# Patient Record
Sex: Female | Born: 1995 | Race: Black or African American | Hispanic: No | Marital: Single | Smoking: Never smoker
Health system: Southern US, Community
[De-identification: ages and names within clinical notes are randomized; demographics above are authoritative.]

---

## 2014-02-21 DEATH — deceased

## 2015-01-27 ENCOUNTER — Emergency Department (HOSPITAL_COMMUNITY): Payer: Self-pay

## 2015-01-27 ENCOUNTER — Emergency Department (HOSPITAL_COMMUNITY)
Admission: EM | Admit: 2015-01-27 | Discharge: 2015-01-27 | Disposition: A | Discharge status: Home / Self Care | Payer: Self-pay | Attending: Emergency Medicine | Admitting: Emergency Medicine

## 2015-01-27 ENCOUNTER — Encounter (HOSPITAL_COMMUNITY): Payer: Self-pay | Admitting: Emergency Medicine

## 2015-01-27 DIAGNOSIS — S3992XA Unspecified injury of lower back, initial encounter: Secondary | ICD-10-CM | POA: Insufficient documentation

## 2015-01-27 DIAGNOSIS — M549 Dorsalgia, unspecified: Secondary | ICD-10-CM

## 2015-01-27 DIAGNOSIS — Y998 Other external cause status: Secondary | ICD-10-CM | POA: Insufficient documentation

## 2015-01-27 DIAGNOSIS — Y92410 Unspecified street and highway as the place of occurrence of the external cause: Secondary | ICD-10-CM | POA: Insufficient documentation

## 2015-01-27 DIAGNOSIS — Y9389 Activity, other specified: Secondary | ICD-10-CM | POA: Insufficient documentation

## 2015-01-27 MED ORDER — OXYCODONE-ACETAMINOPHEN 5-325 MG PO TABS
1.0000 | ORAL_TABLET | Freq: Once | ORAL | Status: AC
Start: 2015-01-27 — End: 2015-01-27
  Administered 2015-01-27: 1 via ORAL
  Filled 2015-01-27: qty 1

## 2015-01-27 MED ORDER — HYDROCODONE-ACETAMINOPHEN 5-325 MG PO TABS
1.0000 | ORAL_TABLET | ORAL | Status: AC | PRN
Start: 2015-01-27 — End: ?

## 2015-01-27 NOTE — ED Notes (Signed)
Patient transported to X-ray 
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
## 2021-12-11 DIAGNOSIS — J3489 Other specified disorders of nose and nasal sinuses: Secondary | ICD-10-CM | POA: Diagnosis not present

## 2022-02-10 DIAGNOSIS — H52209 Unspecified astigmatism, unspecified eye: Secondary | ICD-10-CM | POA: Diagnosis not present

## 2022-02-10 DIAGNOSIS — H5213 Myopia, bilateral: Secondary | ICD-10-CM | POA: Diagnosis not present

## 2022-09-20 DIAGNOSIS — L732 Hidradenitis suppurativa: Secondary | ICD-10-CM | POA: Diagnosis not present

## 2022-10-31 DIAGNOSIS — D649 Anemia, unspecified: Secondary | ICD-10-CM | POA: Diagnosis not present

## 2022-10-31 DIAGNOSIS — E789 Disorder of lipoprotein metabolism, unspecified: Secondary | ICD-10-CM | POA: Diagnosis not present

## 2022-10-31 DIAGNOSIS — Z124 Encounter for screening for malignant neoplasm of cervix: Secondary | ICD-10-CM | POA: Diagnosis not present

## 2022-10-31 DIAGNOSIS — R5383 Other fatigue: Secondary | ICD-10-CM | POA: Diagnosis not present

## 2022-10-31 DIAGNOSIS — N62 Hypertrophy of breast: Secondary | ICD-10-CM | POA: Diagnosis not present

## 2022-10-31 DIAGNOSIS — Z23 Encounter for immunization: Secondary | ICD-10-CM | POA: Diagnosis not present

## 2022-10-31 DIAGNOSIS — R739 Hyperglycemia, unspecified: Secondary | ICD-10-CM | POA: Diagnosis not present

## 2022-10-31 DIAGNOSIS — Z0000 Encounter for general adult medical examination without abnormal findings: Secondary | ICD-10-CM | POA: Diagnosis not present

## 2022-10-31 DIAGNOSIS — Z113 Encounter for screening for infections with a predominantly sexual mode of transmission: Secondary | ICD-10-CM | POA: Diagnosis not present

## 2022-12-19 DIAGNOSIS — L732 Hidradenitis suppurativa: Secondary | ICD-10-CM | POA: Diagnosis not present

## 2023-03-17 DIAGNOSIS — B3731 Acute candidiasis of vulva and vagina: Secondary | ICD-10-CM | POA: Diagnosis not present

## 2024-06-10 DIAGNOSIS — L732 Hidradenitis suppurativa: Secondary | ICD-10-CM | POA: Diagnosis not present

## 2024-06-10 DIAGNOSIS — L02219 Cutaneous abscess of trunk, unspecified: Secondary | ICD-10-CM | POA: Diagnosis not present

## 2024-06-10 DIAGNOSIS — L03319 Cellulitis of trunk, unspecified: Secondary | ICD-10-CM | POA: Diagnosis not present

## 2024-06-14 DIAGNOSIS — L732 Hidradenitis suppurativa: Secondary | ICD-10-CM | POA: Diagnosis not present

## 2024-06-17 DIAGNOSIS — Z7689 Persons encountering health services in other specified circumstances: Secondary | ICD-10-CM | POA: Diagnosis not present
